# Patient Record
Sex: Female | Born: 1960 | Race: White | Hispanic: No | Marital: Single | Smoking: Former smoker
Health system: Southern US, Community
[De-identification: ages and names within clinical notes are randomized; demographics above are authoritative.]

## PROBLEM LIST (undated history)

## (undated) ENCOUNTER — Emergency Department (HOSPITAL_COMMUNITY): Admission: EM | Payer: BC Managed Care – PPO | Source: Home / Self Care

## (undated) DIAGNOSIS — E785 Hyperlipidemia, unspecified: Secondary | ICD-10-CM

## (undated) DIAGNOSIS — E079 Disorder of thyroid, unspecified: Secondary | ICD-10-CM

## (undated) HISTORY — DX: Disorder of thyroid, unspecified: E07.9

## (undated) HISTORY — DX: Hyperlipidemia, unspecified: E78.5

---

## 1997-04-21 ENCOUNTER — Ambulatory Visit (HOSPITAL_COMMUNITY): Admission: RE | Admit: 1997-04-21 | Discharge: 1997-04-21 | Payer: Self-pay | Admitting: Obstetrics and Gynecology

## 1997-12-08 ENCOUNTER — Encounter: Admission: RE | Admit: 1997-12-08 | Discharge: 1998-03-08 | Payer: Self-pay | Admitting: Internal Medicine

## 1998-03-18 ENCOUNTER — Inpatient Hospital Stay (HOSPITAL_COMMUNITY): Admission: AD | Admit: 1998-03-18 | Discharge: 1998-03-18 | Payer: Self-pay | Admitting: Gynecology

## 1998-03-28 ENCOUNTER — Inpatient Hospital Stay (HOSPITAL_COMMUNITY): Admission: AD | Admit: 1998-03-28 | Discharge: 1998-03-28 | Payer: Self-pay | Admitting: Gynecology

## 1998-04-11 ENCOUNTER — Inpatient Hospital Stay (HOSPITAL_COMMUNITY): Admission: AD | Admit: 1998-04-11 | Discharge: 1998-04-11 | Payer: Self-pay | Admitting: Gynecology

## 1998-04-30 ENCOUNTER — Inpatient Hospital Stay (HOSPITAL_COMMUNITY): Admission: AD | Admit: 1998-04-30 | Discharge: 1998-04-30 | Payer: Self-pay | Admitting: Obstetrics and Gynecology

## 1998-05-11 ENCOUNTER — Inpatient Hospital Stay (HOSPITAL_COMMUNITY): Admission: AD | Admit: 1998-05-11 | Discharge: 1998-05-14 | Payer: Self-pay | Admitting: Gynecology

## 1999-08-15 ENCOUNTER — Encounter: Admission: RE | Admit: 1999-08-15 | Discharge: 1999-11-13 | Payer: Self-pay | Admitting: Gynecology

## 2002-02-24 ENCOUNTER — Other Ambulatory Visit: Admission: RE | Admit: 2002-02-24 | Discharge: 2002-02-24 | Payer: Self-pay | Admitting: Gynecology

## 2002-03-13 ENCOUNTER — Encounter: Admission: RE | Admit: 2002-03-13 | Discharge: 2002-06-11 | Payer: Self-pay | Admitting: Gynecology

## 2003-05-12 ENCOUNTER — Other Ambulatory Visit: Admission: RE | Admit: 2003-05-12 | Discharge: 2003-05-12 | Payer: Self-pay | Admitting: Gynecology

## 2003-12-24 ENCOUNTER — Ambulatory Visit: Payer: Self-pay | Admitting: Family Medicine

## 2003-12-28 ENCOUNTER — Ambulatory Visit: Payer: Self-pay | Admitting: Internal Medicine

## 2004-01-13 ENCOUNTER — Ambulatory Visit: Payer: Self-pay | Admitting: Internal Medicine

## 2004-01-20 ENCOUNTER — Ambulatory Visit: Payer: Self-pay | Admitting: Internal Medicine

## 2004-01-27 ENCOUNTER — Ambulatory Visit: Payer: Self-pay | Admitting: Internal Medicine

## 2004-06-13 ENCOUNTER — Other Ambulatory Visit: Admission: RE | Admit: 2004-06-13 | Discharge: 2004-06-13 | Payer: Self-pay | Admitting: Gynecology

## 2004-06-21 ENCOUNTER — Ambulatory Visit: Payer: Self-pay | Admitting: Internal Medicine

## 2004-07-21 ENCOUNTER — Encounter: Admission: RE | Admit: 2004-07-21 | Discharge: 2004-10-19 | Payer: Self-pay | Admitting: Gynecology

## 2005-10-13 ENCOUNTER — Other Ambulatory Visit: Admission: RE | Admit: 2005-10-13 | Discharge: 2005-10-13 | Payer: Self-pay | Admitting: Gynecology

## 2006-10-16 DIAGNOSIS — E119 Type 2 diabetes mellitus without complications: Secondary | ICD-10-CM

## 2006-10-16 DIAGNOSIS — E039 Hypothyroidism, unspecified: Secondary | ICD-10-CM | POA: Insufficient documentation

## 2009-11-16 ENCOUNTER — Encounter
Admission: RE | Admit: 2009-11-16 | Discharge: 2009-11-16 | Payer: Self-pay | Source: Home / Self Care | Attending: Endocrinology | Admitting: Endocrinology

## 2011-07-12 ENCOUNTER — Encounter: Payer: BC Managed Care – PPO | Attending: Endocrinology | Admitting: Dietician

## 2011-07-12 ENCOUNTER — Encounter: Payer: Self-pay | Admitting: Dietician

## 2011-07-12 VITALS — Ht 63.0 in | Wt 235.2 lb

## 2011-07-12 DIAGNOSIS — Z713 Dietary counseling and surveillance: Secondary | ICD-10-CM | POA: Insufficient documentation

## 2011-07-12 DIAGNOSIS — E039 Hypothyroidism, unspecified: Secondary | ICD-10-CM | POA: Insufficient documentation

## 2011-07-12 DIAGNOSIS — E119 Type 2 diabetes mellitus without complications: Secondary | ICD-10-CM | POA: Insufficient documentation

## 2011-07-12 NOTE — Patient Instructions (Addendum)
   Keep up the walking 20+ minutes for 4 times per week and keep moving the bar up.  When you see Dr. Talmage Nap at your next visit, discuss blood glucose monitoring if you wish to monitor.  She will need to give you a prescription for the meter, strips and lancets.  Continue to read you food labels.  Look at serving size and then go to the Total Carbohydrate column  Use the raw number of total carbohydrate.  Try to always have fiber in a product.  Fiber slows there glucose response.  Bread= 2 or more gm fiber per serving.  Cereals and other products = 3+ gm of fiber per serving.  Sugar:  Keep the sugar in the product at (0-9) gm per serving.  Try to have a protein food at every meal and snack.  Try to get 32 oz of water of fluids each day.  Use the meal plan suggestions for helping to keep the calories at about 1400/day and the carbohydrates at 30-14 gm per meal.  Try using the snack suggestions.  Over the summer aim for some weight loss.    You have the right attitude for dealing with this issue.  I enjoyed working with you today.  Let me know if you have further questions.

## 2011-07-12 NOTE — Progress Notes (Signed)
  Medical Nutrition Therapy:  Appt start time: 1500 end time:  1600.   Assessment:  Primary concerns today: Getting my blood sugar under control. Reports her current A1C is at 10.0%.  History of having some possible s/s of pre-diabetes for about 1-2 years and at last MD appointment, found to have DM type 2.  Concerned and wants to gain control of her glucose.  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  Describes her life as stressful.  Plans to spend the time that she is off this summer, trying to get into better shape and gain control of the blood glucose.  BLOOD GLUCOSE: Not Currently monitoring  HYPOGLYCEMIA:  Gives some history of the signs and symptoms of low blood glucose which she treated by eating some peanut butter.  Did a brief review of hypoglycemia.  MEDICATIONS: Completed med review.  Currently on Glimepiride 4 mg for glucose control.  Gives a history of not tolerating Metformin.   DIETARY INTAKE:  Usual eating pattern includes 3 meals and rare snacks per day.  Everyday foods includes:most meats and vegetables..  Avoided foods: include fruit unless it is a smoothie.    24-hr recall:  B ( AM): 7:30  Diet coke. Bowl of cereal (rice crispies or cheerios) and 1 % milk OR English Muffin (high Fiber) with PB, OR toast (whit or white wheat)with cheese and a touch of butter. Snk ( AM): none  L ( PM): 12:15 Makes sandwich of tuna or PB and maybe some sun chips OR leftovers of grill chicken and rice. Water Snk ( PM): Usually no snack. Sometimes some nuts or a bit of PB. D ( PM): 6:30 Fish with zucchini and onions (ate about 1/2 of the meal.) had a salad and some soup. OR Grill hamburger or chicken, rare hot dog.  Starch (potatoes, rice,) and the non-starchy veggies and rolls.Drinks water or diet V8  Snk ( PM): Try not to have a snack. If having a craving for something, will have a weight watchers fudgscicle.  If craving.  OR maybe  popcorn. Beverages: Diet Coke, Diet Splash, water  Usual physical activity: Walking 1 mile 3-4 times per week.  Just started a few days ago  Estimated energy needs:HT:63 in  WT: 235.3 lb  BMI: 41.8 Kg/m2  Adj.Wt: 153 lb (70 kg) 1400 calories 150-155 g carbohydrates 100-105 g protein 37-39 g fat  Progress Towards Goal(s):  In progress.   Nutritional Diagnosis:  Man-2.1 Inpaired nutrition utilization As related to glucose metabolism.  As evidenced by Diagnosis of type 2 diabetes, HgA1C of 10.0%.    Intervention:  Nutrition Review of the carbohydrate restricted diet and the use of exercise, medication, and weight loss for glucose control.  Handouts given during visit include:  Living Well with Diabetes  Blood Glucose Control  Snack Suggestion List  Menu Suggestions for 30 and 45 gm CHO meals  Monitoring/Evaluation:  Dietary intake, exercise, blood glucose status, and body weight In 6-8 weeks.

## 2011-07-13 ENCOUNTER — Encounter: Payer: Self-pay | Admitting: Dietician

## 2011-09-12 ENCOUNTER — Ambulatory Visit: Payer: BC Managed Care – PPO | Admitting: Dietician

## 2011-10-11 ENCOUNTER — Ambulatory Visit: Payer: BC Managed Care – PPO | Admitting: Dietician

## 2012-08-28 ENCOUNTER — Other Ambulatory Visit: Payer: Self-pay

## 2013-05-15 ENCOUNTER — Encounter: Payer: Self-pay | Admitting: Dietician

## 2013-05-15 ENCOUNTER — Encounter: Payer: BC Managed Care – PPO | Attending: Endocrinology | Admitting: Dietician

## 2013-05-15 VITALS — Ht 63.0 in | Wt 242.8 lb

## 2013-05-15 DIAGNOSIS — Z713 Dietary counseling and surveillance: Secondary | ICD-10-CM | POA: Insufficient documentation

## 2013-05-15 DIAGNOSIS — E119 Type 2 diabetes mellitus without complications: Secondary | ICD-10-CM | POA: Insufficient documentation

## 2013-05-15 NOTE — Progress Notes (Signed)
  Medical Nutrition Therapy:  Appt start time: 1645 end time:  1745.   Assessment:  Primary concerns today: Rose Johnson is here today for help with diabetes and to help lose weight. Most recently added a new medication 3 weeks ago and has been making some changes to her diet. Planning to join the gym soon.   Taking blood sugar 3 x day (always below 140 mg/dl). Hgb A1c was 11.1% in March.   Works as a Runner, broadcasting/film/videoteacher and lives with her husband and 2 kids. Has made some changes to her diet such as cutting back on carbs. Goes out to eat about 2 x week.   Preferred Learning Style:   No preference indicated   Learning Readiness:   Ready  MEDICATIONS: see list   DIETARY INTAKE:  Avoided foods include: fruits    24-hr recall:  B ( AM): cereal (Cheerios or Kix) with 1-2% milk or high fiber English Muffin may skip on weekend or [place] sausge Snk ( AM): nuts or peanut butter crackers L ( PM): sandwich on thin bread with [place] or leftover chicken or meatballs  Snk ( PM): nuts or peanut butter crackers D ( PM): grilled chicken or hamburger or egg salad or meatballs  Snk ( PM):spoonful of peanut butter Beverages: 1 soda and water  Usual physical activity: none yet  Estimated energy needs: 1600 calories 180 g carbohydrates 120 g protein 44 g fat  Progress Towards Goal(s):  In progress.   Nutritional Diagnosis:  Hoagland-2.1 Inpaired nutrition utilization As related to glucose metabolism.  As evidenced by Hgb A1c of 11.1%.    Intervention:  Nutrition counseling provided. Discussed the pathophysiology of insulin resistance and factors that affect blood glucose such as carbohydrate foods, exercise, weight loss, and stress. Goals:  Follow Diabetes Meal Plan as instructed  Eat 3 meals and 2 snacks, every 3-5 hrs  Limit carbohydrate intake to 30-45 grams carbohydrate/meal  Limit carbohydrate intake to 0-15 grams carbohydrate/snack  Add lean protein foods to meals/snacks  Monitor glucose levels as  instructed by your doctor  Aim for 30 mins of physical activity daily  Bring food record and glucose log to your next nutrition visit  Teaching Method Utilized:  Visual Auditory Hands on  Handouts given during visit include:  Living Well With Diabetes  Yellow Card  MyPlate Handout  15 g CHO Snacks  Carbohydrate Counting  Barriers to learning/adherence to lifestyle change: none  Demonstrated degree of understanding via:  Teach Back   Monitoring/Evaluation:  Dietary intake, exercise, and body weight in 6 week(s).

## 2013-05-15 NOTE — Patient Instructions (Signed)

## 2013-07-09 ENCOUNTER — Ambulatory Visit: Payer: BC Managed Care – PPO | Admitting: Dietician

## 2013-09-03 ENCOUNTER — Ambulatory Visit: Payer: BC Managed Care – PPO | Admitting: Dietician

## 2014-07-26 ENCOUNTER — Ambulatory Visit (INDEPENDENT_AMBULATORY_CARE_PROVIDER_SITE_OTHER): Payer: BC Managed Care – PPO

## 2014-07-26 ENCOUNTER — Ambulatory Visit (INDEPENDENT_AMBULATORY_CARE_PROVIDER_SITE_OTHER): Payer: BC Managed Care – PPO | Admitting: Emergency Medicine

## 2014-07-26 VITALS — HR 118 | Temp 97.8°F | Resp 20 | Ht 63.0 in | Wt 242.0 lb

## 2014-07-26 DIAGNOSIS — M79645 Pain in left finger(s): Secondary | ICD-10-CM

## 2014-07-26 DIAGNOSIS — S63619A Unspecified sprain of unspecified finger, initial encounter: Secondary | ICD-10-CM | POA: Diagnosis not present

## 2014-07-26 NOTE — Patient Instructions (Signed)
Finger Sprain  A finger sprain is a tear in one of the strong, fibrous tissues that connect the bones (ligaments) in your finger. The severity of the sprain depends on how much of the ligament is torn. The tear can be either partial or complete.  CAUSES   Often, sprains are a result of a fall or accident. If you extend your hands to catch an object or to protect yourself, the force of the impact causes the fibers of your ligament to stretch too much. This excess tension causes the fibers of your ligament to tear.  SYMPTOMS   You may have some loss of motion in your finger. Other symptoms include:   Bruising.   Tenderness.   Swelling.  DIAGNOSIS   In order to diagnose finger sprain, your caregiver will physically examine your finger or thumb to determine how torn the ligament is. Your caregiver may also suggest an X-ray exam of your finger to make sure no bones are broken.  TREATMENT   If your ligament is only partially torn, treatment usually involves keeping the finger in a fixed position (immobilization) for a short period. To do this, your caregiver will apply a bandage, cast, or splint to keep your finger from moving until it heals. For a partially torn ligament, the healing process usually takes 2 to 3 weeks.  If your ligament is completely torn, you may need surgery to reconnect the ligament to the bone. After surgery a cast or splint will be applied and will need to stay on your finger or thumb for 4 to 6 weeks while your ligament heals.  HOME CARE INSTRUCTIONS   Keep your injured finger elevated, when possible, to decrease swelling.   To ease pain and swelling, apply ice to your joint twice a day, for 2 to 3 days:   Put ice in a plastic bag.   Place a towel between your skin and the bag.   Leave the ice on for 15 minutes.   Only take over-the-counter or prescription medicine for pain as directed by your caregiver.   Do not wear rings on your injured finger.   Do not leave your finger unprotected  until pain and stiffness go away (usually 3 to 4 weeks).   Do not allow your cast or splint to get wet. Cover your cast or splint with a plastic bag when you shower or bathe. Do not swim.   Your caregiver may suggest special exercises for you to do during your recovery to prevent or limit permanent stiffness.  SEEK IMMEDIATE MEDICAL CARE IF:   Your cast or splint becomes damaged.   Your pain becomes worse rather than better.  MAKE SURE YOU:   Understand these instructions.   Will watch your condition.   Will get help right away if you are not doing well or get worse.  Document Released: 03/02/2004 Document Revised: 04/17/2011 Document Reviewed: 09/26/2010  ExitCare Patient Information 2015 [employer]. This information is not intended to replace advice given to you by your health care provider. Make sure you discuss any questions you have with your health care provider.

## 2014-07-26 NOTE — Progress Notes (Signed)
Subjective:  Patient ID: Rose Johnson, female    DOB: Sep 04, 1960  Age: 53 y.o. 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 She apparently jammed her third and fourth finger of the hand a week ago. She said no improvement she has pain in the PIP and DIP joints of third and fourth finger. She has some swelling and limitation of motion. She denies any pain that she cannot control with over-the-counter medication.  History Rose Johnson has a past medical history of Thyroid disease; Diabetes mellitus; and Hyperlipidemia.   She has no past surgical history on file.   Her  family history includes Diabetes in her father; Hyperlipidemia in her maternal grandmother and mother.  She   reports that she quit smoking about 18 years ago. She does not have any smokeless tobacco history on file. She reports that she does not drink alcohol or use illicit drugs.  Outpatient Prescriptions Prior to Visit  Medication Sig Dispense Refill  . glimepiride (AMARYL) 4 MG tablet Take 4 mg by mouth daily before breakfast.    . levothyroxine (SYNTHROID, LEVOTHROID) 175 MCG tablet Take 175 mcg by mouth daily.    . simvastatin (ZOCOR) 5 MG tablet Take 5 mg by mouth daily.     No facility-administered medications prior to visit.    History   Social History  . Marital Status: Single    Spouse Name: N/A  . Number of Children: N/A  . Years of Education: N/A   Social History Main Topics  . Smoking status: Former Smoker    Quit date: 07/11/1996  . Smokeless tobacco: Not on file  . Alcohol Use: No  . Drug Use: No  . Sexual Activity: Not on file   Other Topics Concern  . None   Social History Narrative     Review of Systems  Constitutional: Negative for fever, chills and appetite change.  HENT: Negative for congestion, ear pain, postnasal drip, sinus pressure and sore throat.   Eyes: Negative for pain and redness.   Respiratory: Negative for cough, shortness of breath and wheezing.   Cardiovascular: Negative for leg swelling.  Gastrointestinal: Negative for nausea, vomiting, abdominal pain, diarrhea, constipation and blood in stool.  Endocrine: Negative for polyuria.  Genitourinary: Negative for dysuria, urgency, frequency and flank pain.  Musculoskeletal: Positive for joint swelling. Negative for gait problem.  Skin: Negative for rash.  Neurological: Negative for weakness and headaches.  Psychiatric/Behavioral: Negative for confusion and decreased concentration. The patient is not nervous/anxious.     Objective:  Pulse 118  Temp(Src) 97.8 F (36.6 C)  Resp 20  Ht 5\' 3"  (1.6 m)  Wt 242 lb (109.77 kg)  BMI 42.88 kg/m2  SpO2 98%  Physical Exam    Assessment & Plan:   Rose Johnson was seen today for finger injury.  Diagnoses and all orders for this visit:  Pain of finger of left hand Orders: -     DG Hand Complete Left; Future   I am having Rose Johnson maintain her levothyroxine, glimepiride, and simvastatin.  No orders of the defined types were placed in this encounter.    Appropriate red flag conditions were discussed with the patient as well as actions that should be taken.  Patient expressed his understanding.  Follow-up: No Follow-up on file.  Carmelina Dane, MD    [facility] reading (PRIMARY) by  Dr. Dareen Piano negative fingers.Marland Kitchen

## 2015-08-23 ENCOUNTER — Encounter (HOSPITAL_COMMUNITY): Payer: Self-pay

## 2015-08-23 ENCOUNTER — Emergency Department (HOSPITAL_COMMUNITY): Payer: BC Managed Care – PPO

## 2015-08-23 ENCOUNTER — Emergency Department (HOSPITAL_COMMUNITY)
Admission: EM | Admit: 2015-08-23 | Discharge: 2015-08-23 | Disposition: A | Discharge status: Home / Self Care | Payer: BC Managed Care – PPO | Attending: Emergency Medicine | Admitting: Emergency Medicine

## 2015-08-23 DIAGNOSIS — R300 Dysuria: Secondary | ICD-10-CM | POA: Insufficient documentation

## 2015-08-23 DIAGNOSIS — Z79899 Other long term (current) drug therapy: Secondary | ICD-10-CM | POA: Diagnosis not present

## 2015-08-23 DIAGNOSIS — Z7984 Long term (current) use of oral hypoglycemic drugs: Secondary | ICD-10-CM | POA: Insufficient documentation

## 2015-08-23 DIAGNOSIS — E785 Hyperlipidemia, unspecified: Secondary | ICD-10-CM | POA: Insufficient documentation

## 2015-08-23 DIAGNOSIS — R000 Tachycardia, unspecified: Secondary | ICD-10-CM | POA: Diagnosis not present

## 2015-08-23 DIAGNOSIS — Z791 Long term (current) use of non-steroidal anti-inflammatories (NSAID): Secondary | ICD-10-CM | POA: Insufficient documentation

## 2015-08-23 DIAGNOSIS — Z87891 Personal history of nicotine dependence: Secondary | ICD-10-CM | POA: Insufficient documentation

## 2015-08-23 DIAGNOSIS — E119 Type 2 diabetes mellitus without complications: Secondary | ICD-10-CM | POA: Insufficient documentation

## 2015-08-23 DIAGNOSIS — R319 Hematuria, unspecified: Secondary | ICD-10-CM | POA: Insufficient documentation

## 2015-08-23 LAB — CBC WITH DIFFERENTIAL/PLATELET
BASOS ABS: 0 10*3/uL (ref 0.0–0.1)
Basophils Relative: 0 %
Eosinophils Absolute: 0 10*3/uL (ref 0.0–0.7)
Eosinophils Relative: 0 %
HCT: 43.6 % (ref 36.0–46.0)
HEMOGLOBIN: 14 g/dL (ref 12.0–15.0)
LYMPHS ABS: 2.5 10*3/uL (ref 0.7–4.0)
LYMPHS PCT: 16 %
MCH: 26.9 pg (ref 26.0–34.0)
MCHC: 32.1 g/dL (ref 30.0–36.0)
MCV: 83.7 fL (ref 78.0–100.0)
Monocytes Absolute: 0.6 10*3/uL (ref 0.1–1.0)
Monocytes Relative: 4 %
NEUTROS ABS: 12.5 10*3/uL — AB (ref 1.7–7.7)
NEUTROS PCT: 80 %
PLATELETS: 298 10*3/uL (ref 150–400)
RBC: 5.21 MIL/uL — AB (ref 3.87–5.11)
RDW: 13.5 % (ref 11.5–15.5)
WBC: 15.6 10*3/uL — AB (ref 4.0–10.5)

## 2015-08-23 LAB — URINALYSIS, ROUTINE W REFLEX MICROSCOPIC
BILIRUBIN URINE: NEGATIVE
KETONES UR: NEGATIVE mg/dL
Leukocytes, UA: NEGATIVE
NITRITE: NEGATIVE
PH: 6 (ref 5.0–8.0)
Protein, ur: >300 mg/dL — AB
SPECIFIC GRAVITY, URINE: 1.035 — AB (ref 1.005–1.030)

## 2015-08-23 LAB — COMPREHENSIVE METABOLIC PANEL
ALK PHOS: 60 U/L (ref 38–126)
ALT: 45 U/L (ref 14–54)
AST: 38 U/L (ref 15–41)
Albumin: 5 g/dL (ref 3.5–5.0)
Anion gap: 13 (ref 5–15)
BUN: 16 mg/dL (ref 6–20)
CALCIUM: 9.9 mg/dL (ref 8.9–10.3)
CHLORIDE: 101 mmol/L (ref 101–111)
CO2: 25 mmol/L (ref 22–32)
CREATININE: 1.04 mg/dL — AB (ref 0.44–1.00)
GFR, EST NON AFRICAN AMERICAN: 60 mL/min — AB (ref >60)
Glucose, Bld: 247 mg/dL — ABNORMAL HIGH (ref 65–99)
Potassium: 3.7 mmol/L (ref 3.5–5.1)
Sodium: 139 mmol/L (ref 135–145)
Total Bilirubin: 0.9 mg/dL (ref 0.3–1.2)
Total Protein: 8.8 g/dL — ABNORMAL HIGH (ref 6.5–8.1)

## 2015-08-23 LAB — URINE MICROSCOPIC-ADD ON

## 2015-08-23 LAB — HCG, QUANTITATIVE, PREGNANCY: HCG, BETA CHAIN, QUANT, S: 3 m[IU]/mL (ref <5)

## 2015-08-23 MED ORDER — IOPAMIDOL (ISOVUE-300) INJECTION 61%
100.0000 mL | Freq: Once | INTRAVENOUS | Status: AC | PRN
  Administered 2015-08-23: 100 mL via INTRAVENOUS

## 2015-08-23 MED ORDER — KETOROLAC TROMETHAMINE 30 MG/ML IJ SOLN
30.0000 mg | Freq: Once | INTRAMUSCULAR | Status: AC
  Administered 2015-08-23: 30 mg via INTRAVENOUS
  Filled 2015-08-23: qty 1

## 2015-08-23 MED ORDER — LORAZEPAM 2 MG/ML IJ SOLN
1.0000 mg | Freq: Once | INTRAMUSCULAR | Status: AC
  Administered 2015-08-23: 1 mg via INTRAVENOUS
  Filled 2015-08-23: qty 1

## 2015-08-23 MED ORDER — HYDROCHLOROTHIAZIDE 25 MG PO TABS: 25.0000 mg | ORAL_TABLET | Freq: Every day | ORAL | Status: DC

## 2015-08-23 MED ORDER — SODIUM CHLORIDE 0.9 % IV BOLUS (SEPSIS)
1000.0000 mL | Freq: Once | INTRAVENOUS | Status: AC
  Administered 2015-08-23: 1000 mL via INTRAVENOUS

## 2015-08-23 MED ORDER — METOPROLOL TARTRATE 5 MG/5ML IV SOLN
5.0000 mg | Freq: Once | INTRAVENOUS | Status: AC
  Administered 2015-08-23: 5 mg via INTRAVENOUS
  Filled 2015-08-23: qty 5

## 2015-08-23 MED ORDER — ENOXAPARIN SODIUM 120 MG/0.8ML ~~LOC~~ SOLN: 1.0000 mg/kg | Freq: Once | SUBCUTANEOUS | Status: DC

## 2015-08-23 MED ORDER — METOPROLOL TARTRATE 25 MG PO TABS: 12.5000 mg | ORAL_TABLET | Freq: Two times a day (BID) | ORAL | Status: DC

## 2015-08-23 NOTE — Discharge Instructions (Signed)
Please follow up with your primary doctor for discussion of your diagnoses and further evaluation after today's visit; Please return to the ER for new or worsening symptoms, any additional concerns.  °

## 2015-08-23 NOTE — ED Notes (Signed)
Pt ambulatory and independent at discharge.  

## 2015-08-23 NOTE — ED Notes (Signed)
Per pt, started having back pain yesterday.  Pt then urinating blood today.  Pt has burning with urination.

## 2015-08-23 NOTE — ED Provider Notes (Signed)
Briefly, patient presents for hematuria and lower back pain since last night.  Today, no back pain just hematuria and urinary frequency.  UA with large hgb, TNTC RBCs, negative leukocytes, 0-5 WBCs.  No signs of infection.  Cr 1.04.  CT abdomen/pelvis unremarkable.  Sxs likely due to recently passed stone.  Patient is extremely anxious, HR initially 143.  Currently HR 124.  BP initially 214/133, now 178/127.  This is likely 2/2 to anxiety.  Patient has received 2 mg of Ativan and 1L IVF.  EKG obtained due to tachycardia, this shows ST.  Plan to discharge home as tachycardia improves.    Patient remains tachycardic, HR 139.  Denies CP, SOB, dizziness, lightheadedness.  Denies stimulant use.  She is not on any BP medications.  Drinks 3 cans of diet coke a day.  Will give 5 mg Lopressor. HR 106, BP 165/107.  D/c home with Lopressor and HCTZ.  Follow up PCP.  Case has been discussed with Dr. Anitra LauthPlunkett who agrees with the above plan for discharge.     Cheri FowlerKayla Tearia Gibbs, PA-C 08/23/15 2337  Gwyneth SproutWhitney Plunkett, MD 08/23/15 2340

## 2015-08-23 NOTE — ED Provider Notes (Signed)
CSN: [id number]     Arrival date & time 08/23/15  1715 History   First MD Initiated Contact with Patient 08/23/15 1819     Chief Complaint  Patient presents with  . Hematuria    (Consider location/radiation/quality/duration/timing/severity/associated sxs/prior Treatment) Patient is a 55 y.o. female presenting with hematuria. The history is provided by the patient and medical records. No language interpreter was used.  Hematuria Pertinent negatives include no abdominal pain, chills, congestion, coughing, fever, headaches, nausea, neck pain, rash or vomiting.    Rose Johnson is a 55 y.o. female  with a PMH of DM, HLD, thyroid disorder who presents to the Emergency Department complaining of intermittent cramping low back pain last night. When she awoke this morning, she had no back pain, but saw blood in her urine and was experiencing urinary frequency and burning at the end of voiding. She went to the urgent care who took sample of urine but stated there was a large amount of blood and she needed to be evaluated in ER. Blood is only in urine, not seen on toilet paper when wiping. No medications taken prior to arrival for symptoms. At present, she denies any abdominal pain, back pain, flank pain. No fever, chest pain, shortness of breath, vaginal discharge.    Past Medical History  Diagnosis Date  . Thyroid disease   . Diabetes mellitus   . Hyperlipidemia    History reviewed. No pertinent past surgical history. Family History  Problem Relation Age of Onset  . Hyperlipidemia Mother   . Diabetes Father   . Hyperlipidemia Maternal Grandmother    Social History  Substance Use Topics  . Smoking status: Former Smoker    Quit date: 07/11/1996  . Smokeless tobacco: None  . Alcohol Use: No   OB History    No data available     Review of Systems  Constitutional: Negative for fever and chills.  HENT: Negative for congestion.   Eyes: Negative for visual disturbance.  Respiratory:  Negative for cough and shortness of breath.   Cardiovascular: Negative.   Gastrointestinal: Negative for nausea, vomiting and abdominal pain.  Genitourinary: Positive for dysuria and hematuria. Negative for vaginal bleeding, vaginal discharge and vaginal pain.  Musculoskeletal: Positive for back pain (Resolved). Negative for neck pain.  Skin: Negative for rash.  Neurological: Negative for headaches.      Allergies  Review of patient's allergies indicates no known allergies.  Home Medications   Prior to Admission medications   Medication Sig Start Date End Date Taking? Authorizing Provider  glimepiride (AMARYL) 4 MG tablet Take 4 mg by mouth daily before breakfast.   Yes Historical Provider, MD  ibuprofen (ADVIL,MOTRIN) 200 MG tablet Take 200 mg by mouth every 6 (six) hours as needed for moderate pain.   Yes Historical Provider, MD  levothyroxine (SYNTHROID, LEVOTHROID) 150 MCG tablet Take 150 mcg by mouth daily. 07/15/15  Yes Historical Provider, MD  metFORMIN (GLUCOPHAGE-XR) 500 MG 24 hr tablet Take 1,000 mg by mouth 2 (two) times daily. 07/15/15  Yes Historical Provider, MD  simvastatin (ZOCOR) 10 MG tablet TAKE 1 TABLET BY MOUTH ONCE DAILY 07/15/15  Yes Historical Provider, MD   BP 178/127 mmHg  Pulse 124  Temp(Src) 98.5 F (36.9 C) (Oral)  Resp 18  Ht 5\' 3"  (1.6 m)  Wt 108.863 kg  BMI 42.52 kg/m2  SpO2 97% Physical Exam  Constitutional: She is oriented to person, place, and time. She appears well-developed and well-nourished. No distress.  HENT:  Head: Normocephalic and atraumatic.  Cardiovascular: Normal heart sounds and intact distal pulses.  Exam reveals no gallop and no friction rub.   No murmur heard. Tachycardic but regular.   Pulmonary/Chest: Effort normal and breath sounds normal. No respiratory distress. She has no wheezes. She has no rales. She exhibits no tenderness.  Abdominal: Soft. Bowel sounds are normal. She exhibits no distension. There is no tenderness.  No  CVA or flank tenderness.   Musculoskeletal: She exhibits no edema.  Neurological: She is alert and oriented to person, place, and time.  Skin: Skin is warm and dry.  Nursing note and vitals reviewed.   ED Course  Procedures (including critical care time) Labs Review Labs Reviewed  URINALYSIS, ROUTINE W REFLEX MICROSCOPIC (NOT AT [facility]) - Abnormal; Notable for the following:    Color, Urine RED (*)    APPearance CLOUDY (*)    Specific Gravity, Urine 1.035 (*)    Glucose, UA >1000 (*)    Hgb urine dipstick LARGE (*)    Protein, ur >300 (*)    All other components within normal limits  URINE MICROSCOPIC-ADD ON - Abnormal; Notable for the following:    Squamous Epithelial / LPF 0-5 (*)    Bacteria, UA FEW (*)    All other components within normal limits  CBC WITH DIFFERENTIAL/PLATELET - Abnormal; Notable for the following:    WBC 15.6 (*)    RBC 5.21 (*)    Neutro Abs 12.5 (*)    All other components within normal limits  COMPREHENSIVE METABOLIC PANEL - Abnormal; Notable for the following:    Glucose, Bld 247 (*)    Creatinine, Ser 1.04 (*)    Total Protein 8.8 (*)    GFR calc non Af Amer 60 (*)    All other components within normal limits  URINE CULTURE  HCG, QUANTITATIVE, PREGNANCY    Imaging Review Ct Abdomen Pelvis W Contrast  08/23/2015  CLINICAL DATA:  Low back pain beginning last night. The patient reports hematuria. EXAM: CT ABDOMEN AND PELVIS WITH CONTRAST TECHNIQUE: Multidetector CT imaging of the abdomen and pelvis was performed using the standard protocol following bolus administration of intravenous contrast. CONTRAST:  100 ml ISOVUE-300 IOPAMIDOL (ISOVUE-300) INJECTION 61% COMPARISON:  None. FINDINGS: The lung bases are clear.  No pleural or pericardial effusion. The right kidney is ptotic. The kidneys are otherwise unremarkable. Uterus, adnexa and urinary bladder appear normal. The gallbladder appears normal. The liver is somewhat low attenuating compatible with  fatty infiltration. The spleen, adrenal glands and pancreas appear normal. The stomach, small and large bowel and appendix appear normal. There is no lymphadenopathy or fluid. No lytic or sclerotic bony lesion is identified. Multilevel disc bulging is noted. IMPRESSION: No acute abnormality or finding to explain the patient's symptoms. Fatty infiltration of the liver. Electronically Signed   By: Drusilla Kanner M.D.   On: 08/23/2015 21:11   I have personally reviewed and evaluated these images and lab results as part of my medical decision-making.   EKG Interpretation None      MDM   Final diagnoses:  None   Rose Johnson presents to ED for hematuria. Back pain yesterday which has resolved. No pain at this time. Patient is very anxious and worried. BP and heart rate elevated. UA reviewed - hematuria but no signs of infection. Will obtain labs and give fluids.  ativan given as well.   Labs reviewed. Mildly elevated creatinine, wbc elevated as well. CT abd/pelvis ordered and reassuring.  Patient likely passed / is passing kidney stone.   Heart rate still elevated after fluids and 1 of ativan. Will obtain EKG and give 1 more mg of ativan. Care assumed by oncoming provider PA Rose. Case discussed, plan agreed upon. Will follow up on EKG and monitor vitals signs with likely discharge to home.   [facility] HospitalJaime Pilcher Ward, PA-C 08/23/15 2159  Gwyneth SproutWhitney Plunkett, MD 08/23/15 2340

## 2015-08-23 NOTE — ED Notes (Signed)
Pt's husband repeatedly coming out of room asking about discharge.  Pt and husband informed PA and MD will make decision on discharge after reviewing pt's vital signs.  Pt's husband becoming more agitated stating "we have already been here for 8 hours, can you not hurry?".  Still awaiting word from MD.

## 2015-08-23 NOTE — ED Notes (Signed)
PA made aware of pt's increasing anxiety.

## 2015-08-23 NOTE — ED Notes (Signed)
Pt assisted to restroom. Pt stated there was small amount of blood.

## 2015-08-23 NOTE — ED Notes (Signed)
Pt comes in today with a c/o hematuria. Pt states that she began to have lower back pain last night. Pt states that she woke this morning with some blood in her urine. Pt states that she went to urgent care prior to the ED and they sent her over here for further evaluation. Pt denies any history of UTI or kidney stones. Pt also c/o burning while urinating with frequency.

## 2015-08-25 ENCOUNTER — Ambulatory Visit (INDEPENDENT_AMBULATORY_CARE_PROVIDER_SITE_OTHER): Payer: BC Managed Care – PPO | Admitting: Family Medicine

## 2015-08-25 VITALS — BP 154/98 | HR 105 | Temp 98.1°F | Resp 20 | Ht 62.5 in | Wt 232.6 lb

## 2015-08-25 DIAGNOSIS — N3091 Cystitis, unspecified with hematuria: Secondary | ICD-10-CM

## 2015-08-25 DIAGNOSIS — R319 Hematuria, unspecified: Secondary | ICD-10-CM | POA: Diagnosis not present

## 2015-08-25 DIAGNOSIS — R3915 Urgency of urination: Secondary | ICD-10-CM

## 2015-08-25 DIAGNOSIS — E119 Type 2 diabetes mellitus without complications: Secondary | ICD-10-CM | POA: Diagnosis not present

## 2015-08-25 DIAGNOSIS — R000 Tachycardia, unspecified: Secondary | ICD-10-CM | POA: Diagnosis not present

## 2015-08-25 DIAGNOSIS — E039 Hypothyroidism, unspecified: Secondary | ICD-10-CM | POA: Diagnosis not present

## 2015-08-25 DIAGNOSIS — D72829 Elevated white blood cell count, unspecified: Secondary | ICD-10-CM

## 2015-08-25 DIAGNOSIS — I10 Essential (primary) hypertension: Secondary | ICD-10-CM | POA: Diagnosis not present

## 2015-08-25 DIAGNOSIS — I160 Hypertensive urgency: Secondary | ICD-10-CM

## 2015-08-25 LAB — POC MICROSCOPIC URINALYSIS (UMFC): MUCUS RE: ABSENT

## 2015-08-25 LAB — POCT URINALYSIS DIP (MANUAL ENTRY)
BILIRUBIN UA: NEGATIVE
Ketones, POC UA: NEGATIVE
NITRITE UA: POSITIVE — AB
PH UA: 7
Protein Ur, POC: 100 — AB
SPEC GRAV UA: 1.02
UROBILINOGEN UA: 0.2

## 2015-08-25 LAB — URINE CULTURE

## 2015-08-25 LAB — GLUCOSE, POCT (MANUAL RESULT ENTRY): POC Glucose: 162 mg/dl — AB (ref 70–99)

## 2015-08-25 MED ORDER — NITROFURANTOIN MONOHYD MACRO 100 MG PO CAPS: 100.0000 mg | ORAL_CAPSULE | Freq: Two times a day (BID) | ORAL | Status: AC

## 2015-08-25 MED ORDER — METOPROLOL TARTRATE 25 MG PO TABS: 12.5000 mg | ORAL_TABLET | Freq: Two times a day (BID) | ORAL | Status: AC

## 2015-08-25 NOTE — Patient Instructions (Addendum)
For the blood in the urine and your urinary symptoms, start the antibiotic, one pill twice per day. I will recheck the urine culture to see if there is infection on today's urine, and we will let you know about those results. If you have any worsening of the symptoms, fever, or back pain returns, return for recheck.  For your blood pressure, it did go down some when we rechecked it. You can continue one half pill of the metoprolol twice per day, but iff running over 160/100 - increase to 1 full pill twice per day. Follow-up with a primary care provider or us within the next 2 weeks to see if those readings are improved, and to discuss possible changes in meds (you may benefit from a different class of medication called ace inhibitors with having diabetes, but want to make sure kidney test is ok first). Keep a record of your blood pressures outside of the office and bring them to the next office visit.   Return to the clinic or go to the nearest emergency room if any of your symptoms worsen or new symptoms occur.     Urinary Tract Infection Urinary tract infections (UTIs) can develop anywhere along your urinary tract. Your urinary tract is your body's drainage system for removing wastes and extra water. Your urinary tract includes two kidneys, two ureters, a bladder, and a urethra. Your kidneys are a pair of bean-shaped organs. Each kidney is about the size of your fist. They are located below your ribs, one on each side of your spine. CAUSES Infections are caused by microbes, which are microscopic organisms, including fungi, viruses, and bacteria. These organisms are so small that they can only be seen through a microscope. Bacteria are the microbes that most commonly cause UTIs. SYMPTOMS  Symptoms of UTIs may vary by age and gender of the patient and by the location of the infection. Symptoms in young women typically include a frequent and intense urge to urinate and a painful, burning feeling in  the bladder or urethra during urination. Older women and men are more likely to be tired, shaky, and weak and have muscle aches and abdominal pain. A fever may mean the infection is in your kidneys. Other symptoms of a kidney infection include pain in your back or sides below the ribs, nausea, and vomiting. DIAGNOSIS To diagnose a UTI, your caregiver will ask you about your symptoms. Your caregiver will also ask you to provide a urine sample. The urine sample will be tested for bacteria and white blood cells. White blood cells are made by your body to help fight infection. TREATMENT  Typically, UTIs can be treated with medication. Because most UTIs are caused by a bacterial infection, they usually can be treated with the use of antibiotics. The choice of antibiotic and length of treatment depend on your symptoms and the type of bacteria causing your infection. HOME CARE INSTRUCTIONS  If you were prescribed antibiotics, take them exactly as your caregiver instructs you. Finish the medication even if you feel better after you have only taken some of the medication.  Drink enough water and fluids to keep your urine clear or pale yellow.  Avoid caffeine, tea, and carbonated beverages. They tend to irritate your bladder.  Empty your bladder often. Avoid holding urine for long periods of time.  Empty your bladder before and after sexual intercourse.  After a bowel movement, women should cleanse from front to back. Use each tissue only once. SEEK MEDICAL CARE  IF:   You have back pain.  You develop a fever.  Your symptoms do not begin to resolve within 3 days. SEEK IMMEDIATE MEDICAL CARE IF:   You have severe back pain or lower abdominal pain.  You develop chills.  You have nausea or vomiting.  You have continued burning or discomfort with urination. MAKE SURE YOU:   Understand these instructions.  Will watch your condition.  Will get help right away if you are not doing well or get  worse.   This information is not intended to replace advice given to you by your health care provider. Make sure you discuss any questions you have with your health care provider.   Document Released: 11/02/2004 Document Revised: 10/14/2014 Document Reviewed: 03/03/2011 Elsevier Interactive Patient Education 2016 [employer] Hematuria, Adult Hematuria is blood in your urine. It can be caused by a bladder infection, kidney infection, prostate infection, kidney stone, or cancer of your urinary tract. Infections can usually be treated with medicine, and a kidney stone usually will pass through your urine. If neither of these is the cause of your hematuria, further workup to find out the reason may be needed. It is very important that you tell your health care provider about any blood you see in your urine, even if the blood stops without treatment or happens without causing pain. Blood in your urine that happens and then stops and then happens again can be a symptom of a very serious condition. Also, pain is not a symptom in the initial stages of many urinary cancers. HOME CARE INSTRUCTIONS   Drink lots of fluid, 3-4 quarts a day. If you have been diagnosed with an infection, cranberry juice is especially recommended, in addition to large amounts of water.  Avoid caffeine, tea, and carbonated beverages because they tend to irritate the bladder.  Avoid alcohol because it may irritate the prostate.  Take all medicines as directed by your health care provider.  If you were prescribed an antibiotic medicine, finish it all even if you start to feel better.  If you have been diagnosed with a kidney stone, follow your health care provider's instructions regarding straining your urine to catch the stone.  Empty your bladder often. Avoid holding urine for long periods of time.  After a bowel movement, women should cleanse front to back. Use each tissue only once.  Empty your bladder before and  after sexual intercourse if you are a female. SEEK MEDICAL CARE IF:  You develop back pain.  You have a fever.  You have a feeling of sickness in your stomach (nausea) or vomiting.  Your symptoms are not better in 3 days. Return sooner if you are getting worse. SEEK IMMEDIATE MEDICAL CARE IF:   You develop severe vomiting and are unable to keep the medicine down.  You develop severe back or abdominal pain despite taking your medicines.  You begin passing a large amount of blood or clots in your urine.  You feel extremely weak or faint, or you pass out. MAKE SURE YOU:   Understand these instructions.  Will watch your condition.  Will get help right away if you are not doing well or get worse.   This information is not intended to replace advice given to you by your health care provider. Make sure you discuss any questions you have with your health care provider.   Document Released: 01/23/2005 Document Revised: 02/13/2014 Document Reviewed: 09/23/2012 [employer] Interactive Patient Education [employer]  IF you received an x-ray today, you will receive an invoice from [facility]. Please contact [facility] at [phone] with questions or concerns regarding your invoice.   IF you received labwork today, you will receive an invoice from [facility]. Please contact [facility] at [phone] with questions or concerns regarding your invoice.   Our billing staff will not be able to assist you with questions regarding bills from these companies.  You will be contacted with the lab results as soon as they are available. The fastest way to get your results is to activate your My Chart account. Instructions are located on the last page of this paperwork. If you have not heard from us regarding the results in 2 weeks, please contact this office.      

## 2015-08-25 NOTE — Progress Notes (Signed)
Subjective:  By signing my name below, I, Raven Small, attest that this documentation has been prepared under the direction and in the presence of Meredith Staggers, MD.  Electronically Signed: Andrew Au, ED Scribe. 08/25/2015. 5:02 PM.   Patient ID: Rose Johnson, female    DOB: 11/29/1960, 55 y.o.   MRN: [id number]  HPI Chief Complaint  Patient presents with  . Follow-up    hospital follow up - for Kidney stone  . Urinary Urgency    HPI Comments: Rose Johnson is a 55 y.o. female who presents to the [facility] follow up. She has a hx of HTN, DM, HLD. PCP is Dr. Talmage Nap.  She was seen in the ED 2 days ago with hematuria and low back pain. She noted low back pain the night before and blood in urine along with frequency and dysuria. No back pain at the time of that visit. Of note BP was 178/127 and was tachycardic with rate of 124 but afebrile. Urine was notable for gross hematuria. CBC WBC 15.6. Glucose was 247 on CMP. Creatine 1.04. She had CT abdomen and pelvis with no acute abnormality. Apparently she appeared anxious and was given 2 mg total of ativan. EKG was sinus tachy, multiple PVC's borderline right deviation. She was given 5mg  of lopressor with improvement to HR at 106 and BP at 165/107. Discharged on both lopressor and HCTZ. Urine culture multiple species, recommended repeat.   Pt states hematuria resolved yesterday.  Today she reports improving urgency, frequency and intermittent dysuria along with abdominal cramping. No back pain. She has been taking Azo.   Pt states her BP usually runs 140/85-90. She has not been on BP medication in past. She has been taking Lopressor, half tablet, 2 doses since seen in the ED, one last night and one this morning. She has not started HCTZ. She does not have home readings yet. Her HR has ranged between 80-90. She denies CP, slurred speech, HA's, and blurred vision.   She has hx of hypothyroidism, followed by Dr. Talmage Nap.   States she last had thyroid checked 11/2014. She is on synthroid 105 mcg. She denies thyroid surgery or radiation.   She has not checked her sugars in "a while".    Pt is looking for a new PCP.   Patient Active Problem List   Diagnosis Date Noted  . HYPOTHYROIDISM 10/16/2006  . DIABETES MELLITUS, TYPE II 10/16/2006   Past Medical History  Diagnosis Date  . Thyroid disease   . Diabetes mellitus   . Hyperlipidemia    History reviewed. No pertinent past surgical history. No Known Allergies Prior to Admission medications   Medication Sig Start Date End Date Taking? Authorizing Provider  glimepiride (AMARYL) 4 MG tablet Take 4 mg by mouth daily before breakfast.   Yes Historical Provider, MD  ibuprofen (ADVIL,MOTRIN) 200 MG tablet Take 200 mg by mouth every 6 (six) hours as needed for moderate pain.   Yes Historical Provider, MD  levothyroxine (SYNTHROID, LEVOTHROID) 150 MCG tablet Take 150 mcg by mouth daily. 07/15/15  Yes Historical Provider, MD  metFORMIN (GLUCOPHAGE-XR) 500 MG 24 hr tablet Take 1,000 mg by mouth 2 (two) tsimes daily. 07/15/15  Yes Historical Provider, MD  metoprolol (LOPRESSOR) 25 MG tablet Take 0.5 tablets (12.5 mg total) by mouth 2 (two) times daily. 08/23/15  Yes Kayla Rose, PA-C  simvastatin (ZOCOR) 10 MG tablet TAKE 1 TABLET BY MOUTH ONCE DAILY 07/15/15  Yes Historical Provider, MD  Social History   Social History  . Marital Status: Single    Spouse Name: N/A  . Number of Children: N/A  . Years of Education: N/A   Occupational History  . Not on file.   Social History Main Topics  . Smoking status: Former Smoker    Quit date: 07/11/1996  . Smokeless tobacco: Not on file  . Alcohol Use: No  . Drug Use: No  . Sexual Activity: Not on file   Other Topics Concern  . Not on file   Social History Narrative   Review of Systems  Constitutional: Negative for fatigue and unexpected weight change.  Eyes: Negative for visual disturbance.  Respiratory: Negative  for chest tightness and shortness of breath.   Cardiovascular: Negative for chest pain, palpitations and leg swelling.  Gastrointestinal: Positive for abdominal pain. Negative for blood in stool.  Genitourinary: Positive for dysuria, urgency and frequency. Negative for hematuria and difficulty urinating.  Neurological: Negative for dizziness, syncope, speech difficulty, weakness, light-headedness and headaches.   Objective:   Physical Exam  Constitutional: She is oriented to person, place, and time. She appears well-developed and well-nourished.  HENT:  Head: Normocephalic and atraumatic.  Eyes: Conjunctivae and EOM are normal. Pupils are equal, round, and reactive to light.  Neck: Carotid bruit is not present.  Cardiovascular: Regular rhythm, normal heart sounds and intact distal pulses.  Tachycardia present.   Pulmonary/Chest: Effort normal and breath sounds normal.  Abdominal: Soft. She exhibits no pulsatile midline mass. There is no tenderness. There is no CVA tenderness.  Musculoskeletal: She exhibits no edema.  No LE edema  Neurological: She is alert and oriented to person, place, and time.  Skin: Skin is warm and dry.  Psychiatric: She has a normal mood and affect. Her behavior is normal.  Vitals reviewed.  Filed Vitals:   08/25/15 1645  BP: 175/127  Pulse: 124  Temp: 98.1 F (36.7 C)  TempSrc: Oral  Resp: 20  Height: 5' 2.5" (1.588 m)  Weight: 232 lb 9.6 oz (105.507 kg)  SpO2: 96%   Results for orders placed or performed in visit on 08/25/15  POCT Microscopic Urinalysis ([facility])  Result Value Ref Range   WBC,UR,HPF,POC Many (A) None WBC/hpf   RBC,UR,HPF,POC Many (A) None RBC/hpf   Bacteria Moderate (A) None, Too numerous to count   Mucus Absent Absent   Epithelial Cells, UR Per Microscopy Moderate (A) None, Too numerous to count cells/hpf  POCT urinalysis dipstick  Result Value Ref Range   Color, UA orange (A) yellow   Clarity, UA clear clear   Glucose, UA =100  (A) negative   Bilirubin, UA negative negative   Ketones, POC UA negative negative   Spec Grav, UA 1.020    Blood, UA moderate (A) negative   pH, UA 7.0    Protein Ur, POC =100 (A) negative   Urobilinogen, UA 0.2    Nitrite, UA Positive (A) Negative   Leukocytes, UA small (1+) (A) Negative  POCT glucose (manual entry)  Result Value Ref Range   POC Glucose 162 (A) 70 - 99 mg/dl   Assessment & Plan:   Rose GobbleKaren L Bowie is a 55 y.o. female Hemorrhagic cystitis - Plan: nitrofurantoin, macrocrystal-monohydrate, (MACROBID) 100 MG capsule Blood in the urine - Plan: POCT Microscopic Urinalysis ([facility]), POCT urinalysis dipstick, Basic metabolic panel, nitrofurantoin, macrocrystal-monohydrate, (MACROBID) 100 MG capsule Urgency of urination - Plan: POCT Microscopic Urinalysis ([facility]), POCT urinalysis dipstick, Urine culture, nitrofurantoin, macrocrystal-monohydrate, (MACROBID) 100 MG capsule  -  Possible nephrolith that had resolved by the time of CT in hospital, but with pyuria, and urinary symptoms, will treat for possible hemorrhagic cystitis at this time. Also will repeat urine culture as multiple bacteria noted on initial culture. Fluids, RTC precautions, and will need to have urine repeated to make sure the blood clears.  Hypertensive urgency - Plan: TSH, Basic metabolic panel, metoprolol tartrate (LOPRESSOR) 25 MG tablet Tachycardia - Plan: TSH  -Some whitecoat component with her hypertension, but still uncontrolled. No other symptoms while in office.  -Continue metoprolol 12.5 mg twice a day for now, but if persistent elevations above 160/90, increase to 25 mg twice a day. Also discussed possible ACE inhibitor given her diabetes, but again discussed at follow-up with PCP, within 2 weeks.  -ER/RTC precautions if persistent elevated pressures, or any new symptoms.  -Repeat BMP,   Hypothyroidism, unspecified hypothyroidism type - Plan: TSH  -Check TSH with the hypotension and tachycardia, but  less likely thyroid storm.  Type 2 diabetes mellitus without complication, without long-term current use of insulin (HCC) - Plan: POCT glucose (manual entry)  -Follow-up with Dr. Talmage Nap. glucose reading in office improved versus ER visit 2 days ago.  Leukocytosis - Plan: CBC with Differential/Platelet  -Repeat CBC, afebrile currently, and may be due to UTI.  Meds ordered this encounter  Medications  . nitrofurantoin, macrocrystal-monohydrate, (MACROBID) 100 MG capsule    Sig: Take 1 capsule (100 mg total) by mouth 2 (two) times daily.    Dispense:  14 capsule    Refill:  0  . metoprolol tartrate (LOPRESSOR) 25 MG tablet    Sig: Take 0.5 tablets (12.5 mg total) by mouth 2 (two) times daily.    Dispense:  30 tablet    Refill:  0   Patient Instructions     For the blood in the urine and your urinary symptoms, start the antibiotic, one pill twice per day. I will recheck the urine culture to see if there is infection on today's urine, and we will let you know about those results. If you have any worsening of the symptoms, fever, or back pain returns, return for recheck.  For your blood pressure, it did go down some when we rechecked it. You can continue one half pill of the metoprolol twice per day, but iff running over 160/100 - increase to 1 full pill twice per day. Follow-up with a primary care provider or [place] within the next 2 weeks to see if those readings are improved, and to discuss possible changes in meds (you may benefit from a different class of medication called ace inhibitors with having diabetes, but want to make sure kidney test is ok first). Keep a record of your blood pressures outside of the office and bring them to the next office visit.   Return to the clinic or go to the nearest emergency room if any of your symptoms worsen or new symptoms occur.     Urinary Tract Infection Urinary tract infections (UTIs) can develop anywhere along your urinary tract. Your urinary tract  is your body's drainage system for removing wastes and extra water. Your urinary tract includes two kidneys, two ureters, a bladder, and a urethra. Your kidneys are a pair of bean-shaped organs. Each kidney is about the size of your fist. They are located below your ribs, one on each side of your spine. CAUSES Infections are caused by microbes, which are microscopic organisms, including fungi, viruses, and bacteria. These organisms are so small that  they can only be seen through a microscope. Bacteria are the microbes that most commonly cause UTIs. SYMPTOMS  Symptoms of UTIs may vary by age and gender of the patient and by the location of the infection. Symptoms in young women typically include a frequent and intense urge to urinate and a painful, burning feeling in the bladder or urethra during urination. Older women and men are more likely to be tired, shaky, and weak and have muscle aches and abdominal pain. A fever may mean the infection is in your kidneys. Other symptoms of a kidney infection include pain in your back or sides below the ribs, nausea, and vomiting. DIAGNOSIS To diagnose a UTI, your caregiver will ask you about your symptoms. Your caregiver will also ask you to provide a urine sample. The urine sample will be tested for bacteria and white blood cells. White blood cells are made by your body to help fight infection. TREATMENT  Typically, UTIs can be treated with medication. Because most UTIs are caused by a bacterial infection, they usually can be treated with the use of antibiotics. The choice of antibiotic and length of treatment depend on your symptoms and the type of bacteria causing your infection. HOME CARE INSTRUCTIONS  If you were prescribed antibiotics, take them exactly as your caregiver instructs you. Finish the medication even if you feel better after you have only taken some of the medication.  Drink enough water and fluids to keep your urine clear or pale  yellow.  Avoid caffeine, tea, and carbonated beverages. They tend to irritate your bladder.  Empty your bladder often. Avoid holding urine for long periods of time.  Empty your bladder before and after sexual intercourse.  After a bowel movement, women should cleanse from front to back. Use each tissue only once. SEEK MEDICAL CARE IF:   You have back pain.  You develop a fever.  Your symptoms do not begin to resolve within 3 days. SEEK IMMEDIATE MEDICAL CARE IF:   You have severe back pain or lower abdominal pain.  You develop chills.  You have nausea or vomiting.  You have continued burning or discomfort with urination. MAKE SURE YOU:   Understand these instructions.  Will watch your condition.  Will get help right away if you are not doing well or get worse.   This information is not intended to replace advice given to you by your health care provider. Make sure you discuss any questions you have with your health care provider.   Document Released: 11/02/2004 Document Revised: 10/14/2014 Document Reviewed: 03/03/2011 Elsevier Interactive Patient Education 2016 [employer]. Hematuria, Adult Hematuria is blood in your urine. It can be caused by a bladder infection, kidney infection, prostate infection, kidney stone, or cancer of your urinary tract. Infections can usually be treated with medicine, and a kidney stone usually will pass through your urine. If neither of these is the cause of your hematuria, further workup to find out the reason may be needed. It is very important that you tell your health care provider about any blood you see in your urine, even if the blood stops without treatment or happens without causing pain. Blood in your urine that happens and then stops and then happens again can be a symptom of a very serious condition. Also, pain is not a symptom in the initial stages of many urinary cancers. HOME CARE INSTRUCTIONS   Drink lots of fluid, 3-4 quarts a  day. If you have been diagnosed with an  infection, cranberry juice is especially recommended, in addition to large amounts of water.  Avoid caffeine, tea, and carbonated beverages because they tend to irritate the bladder.  Avoid alcohol because it may irritate the prostate.  Take all medicines as directed by your health care provider.  If you were prescribed an antibiotic medicine, finish it all even if you start to feel better.  If you have been diagnosed with a kidney stone, follow your health care provider's instructions regarding straining your urine to catch the stone.  Empty your bladder often. Avoid holding urine for long periods of time.  After a bowel movement, women should cleanse front to back. Use each tissue only once.  Empty your bladder before and after sexual intercourse if you are a female. SEEK MEDICAL CARE IF:  You develop back pain.  You have a fever.  You have a feeling of sickness in your stomach (nausea) or vomiting.  Your symptoms are not better in 3 days. Return sooner if you are getting worse. SEEK IMMEDIATE MEDICAL CARE IF:   You develop severe vomiting and are unable to keep the medicine down.  You develop severe back or abdominal pain despite taking your medicines.  You begin passing a large amount of blood or clots in your urine.  You feel extremely weak or faint, or you pass out. MAKE SURE YOU:   Understand these instructions.  Will watch your condition.  Will get help right away if you are not doing well or get worse.   This information is not intended to replace advice given to you by your health care provider. Make sure you discuss any questions you have with your health care provider.   Document Released: 01/23/2005 Document Revised: 02/13/2014 Document Reviewed: 09/23/2012 Elsevier Interactive Patient Education 2016 [employer].      IF you received an x-ray today, you will receive an invoice from [facility]. Please  contact [facility] at [phone] with questions or concerns regarding your invoice.   IF you received labwork today, you will receive an invoice from [employer]. Please contact [facility] at [phone] with questions or concerns regarding your invoice.   Our billing staff will not be able to assist you with questions regarding bills from these companies.  You will be contacted with the lab results as soon as they are available. The fastest way to get your results is to activate your My Chart account. Instructions are located on the last page of this paperwork. If you have not heard from [place] regarding the results in 2 weeks, please contact this office.         I personally performed the services described in this documentation, which was scribed in my presence. The recorded information has been reviewed and considered, and addended by me as needed.   Signed,   Meredith Staggers, MD [facility].  08/25/2015 11:49 PM

## 2015-08-26 LAB — CBC WITH DIFFERENTIAL/PLATELET
BASOS ABS: 0 {cells}/uL (ref 0–200)
Basophils Relative: 0 %
EOS ABS: 104 {cells}/uL (ref 15–500)
Eosinophils Relative: 1 %
HEMATOCRIT: 40.9 % (ref 35.0–45.0)
HEMOGLOBIN: 13.1 g/dL (ref 11.7–15.5)
LYMPHS ABS: 3120 {cells}/uL (ref 850–3900)
LYMPHS PCT: 30 %
MCH: 26.6 pg — AB (ref 27.0–33.0)
MCHC: 32 g/dL (ref 32.0–36.0)
MCV: 83 fL (ref 80.0–100.0)
MONO ABS: 728 {cells}/uL (ref 200–950)
MPV: 10.9 fL (ref 7.5–12.5)
Monocytes Relative: 7 %
NEUTROS PCT: 62 %
Neutro Abs: 6448 cells/uL (ref 1500–7800)
Platelets: 280 10*3/uL (ref 140–400)
RBC: 4.93 MIL/uL (ref 3.80–5.10)
RDW: 14.4 % (ref 11.0–15.0)
WBC: 10.4 10*3/uL (ref 3.8–10.8)

## 2015-08-26 LAB — TSH: TSH: 4.38 mIU/L

## 2015-08-26 LAB — BASIC METABOLIC PANEL
BUN: 15 mg/dL (ref 7–25)
CO2: 28 mmol/L (ref 20–31)
Calcium: 9.6 mg/dL (ref 8.6–10.4)
Chloride: 99 mmol/L (ref 98–110)
Creat: 0.91 mg/dL (ref 0.50–1.05)
GLUCOSE: 146 mg/dL — AB (ref 65–99)
POTASSIUM: 3.7 mmol/L (ref 3.5–5.3)
SODIUM: 136 mmol/L (ref 135–146)

## 2015-08-28 LAB — URINE CULTURE: Colony Count: >=100000

## 2015-10-01 ENCOUNTER — Other Ambulatory Visit: Payer: Self-pay | Admitting: Family Medicine

## 2015-10-01 DIAGNOSIS — I160 Hypertensive urgency: Secondary | ICD-10-CM

## 2017-06-24 IMAGING — CT CT ABD-PELV W/ CM
3 of 5 series · 16 of 46 positions shown, 18 images · IV contrast (ISOVUE)
Comparison: None.

CLINICAL DATA: Low back pain beginning last night. The patient
reports hematuria.

EXAM:
CT ABDOMEN AND PELVIS WITH CONTRAST
TECHNIQUE: Multidetector CT imaging of the abdomen and pelvis was performed
using the standard protocol following bolus administration of
intravenous contrast.
CONTRAST:  100 ml TNWGRW-WJJ IOPAMIDOL (TNWGRW-WJJ) INJECTION 61%

[Series 2: abd/pel with · axial · 0.84mm/px · z∈[+966,+1321]mm · 12 of 85 slices shown, 14 images]
[im 7/85  soft-tissue]
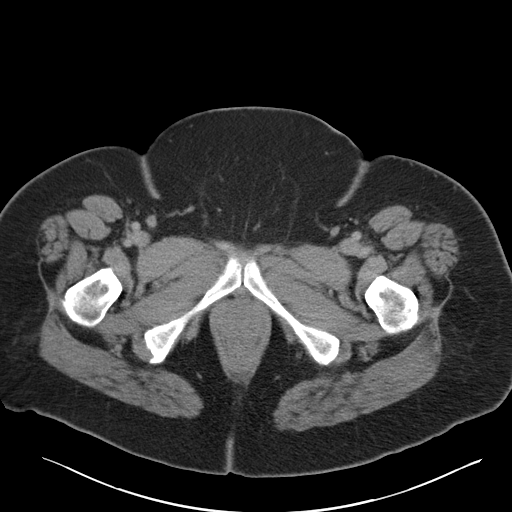
[im 7/85  bone]
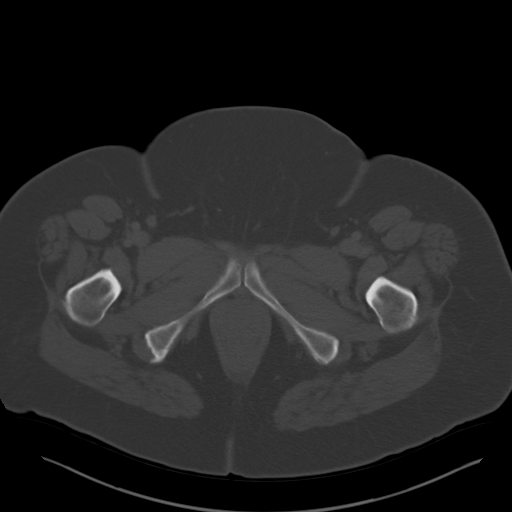
[im 13/85  soft-tissue]
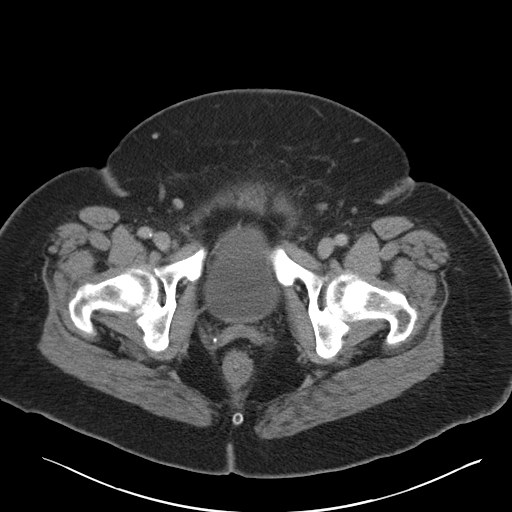
[im 20/85  soft-tissue]
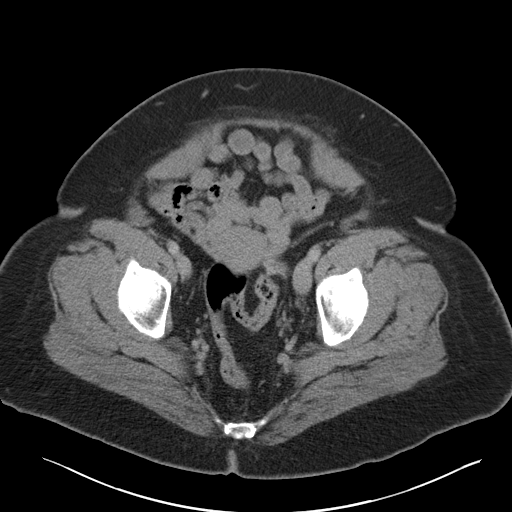
[im 26/85  soft-tissue]
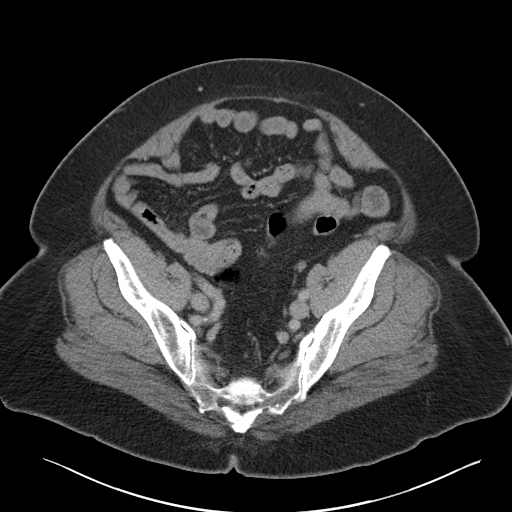
[im 33/85  soft-tissue]
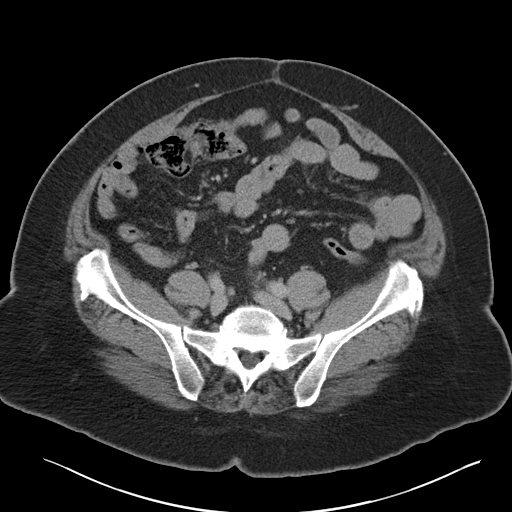
[im 39/85  soft-tissue]
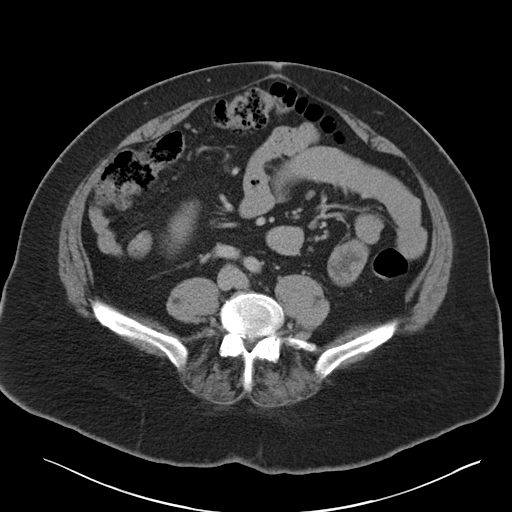
[im 46/85  soft-tissue]
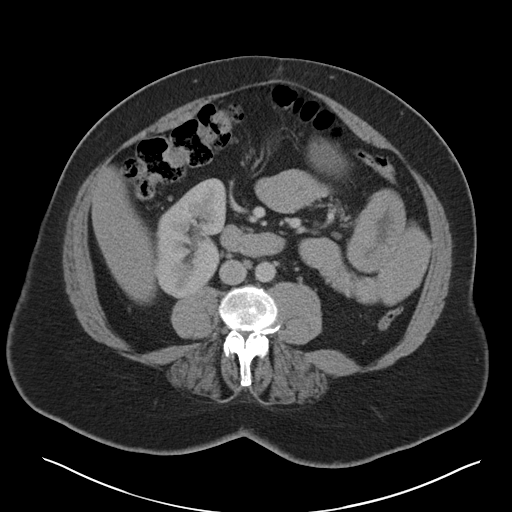
[im 52/85  soft-tissue]
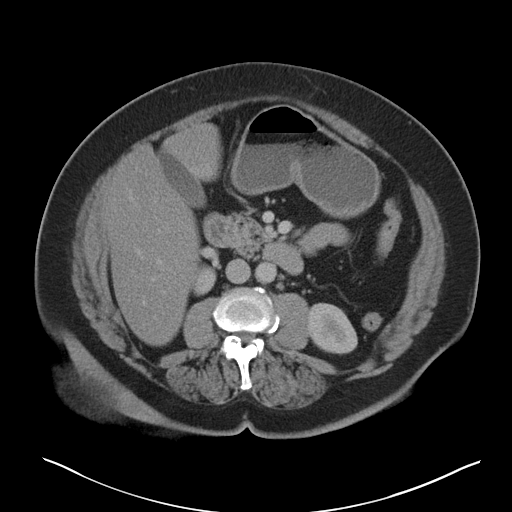
[im 59/85  soft-tissue]
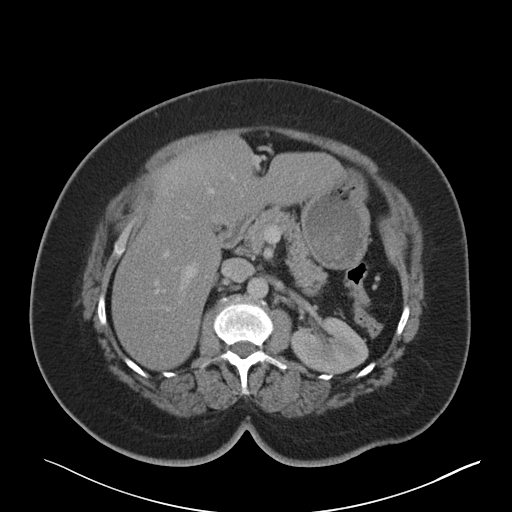
[im 59/85  bone]
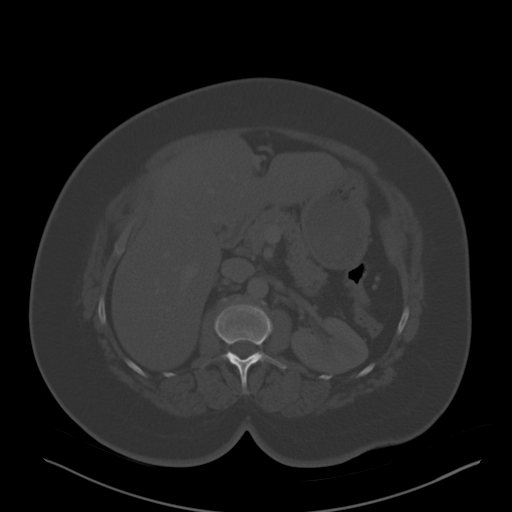
[im 65/85  soft-tissue]
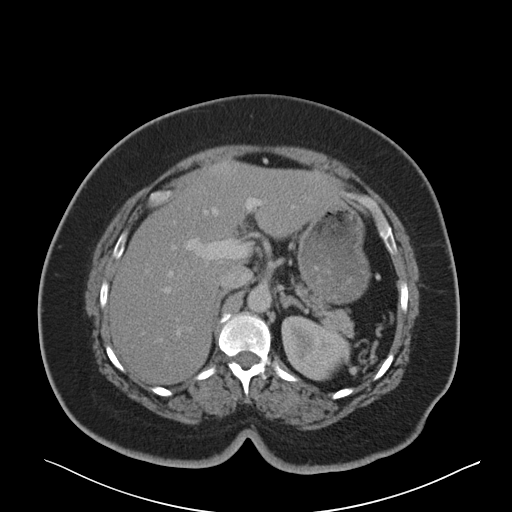
[im 72/85  soft-tissue]
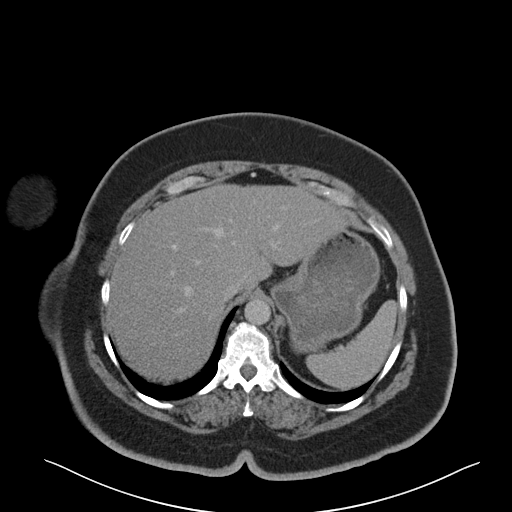
[im 78/85  soft-tissue]
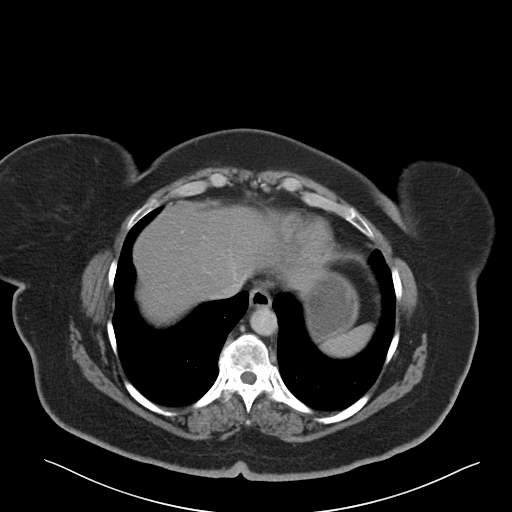

[Series 4: coronal a/|p · coronal · 0.94mm/px · 3 of 191 slices shown]
[im 64/191  soft-tissue]
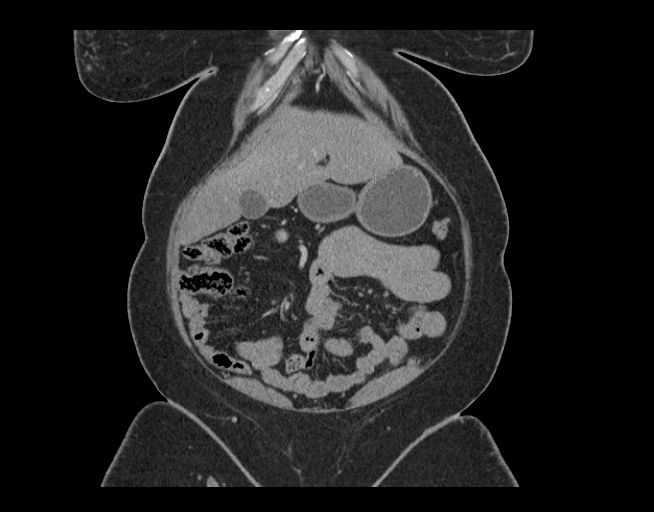
[im 85/191  soft-tissue]
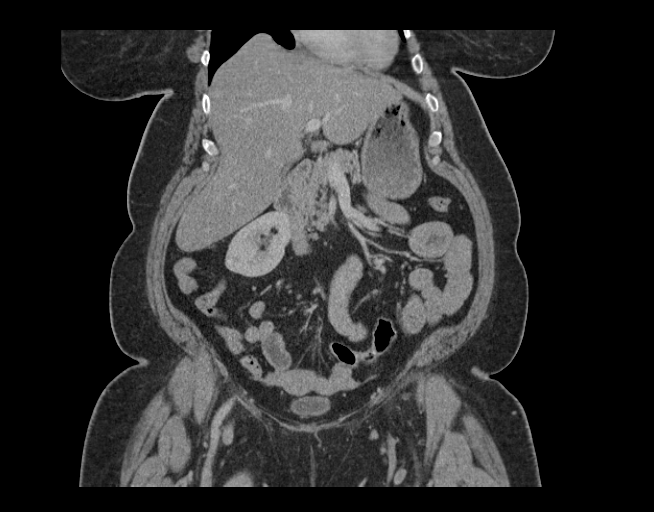
[im 106/191  soft-tissue]
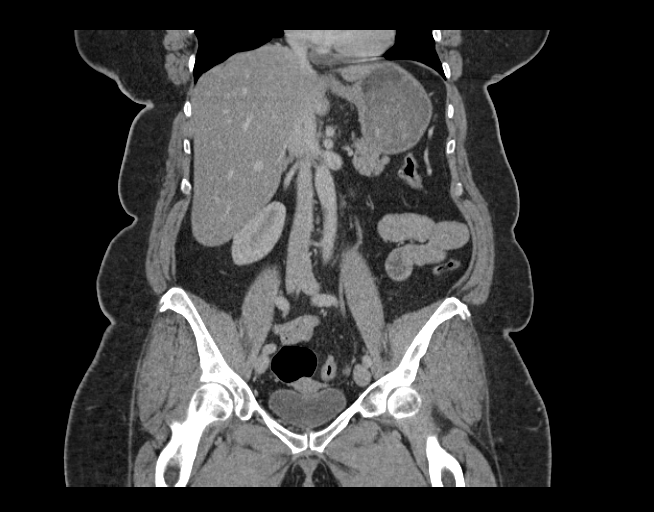

[Series 7: lung · axial · 0.84mm/px · 1 of 60 slices shown]
[im 6/60  bone]
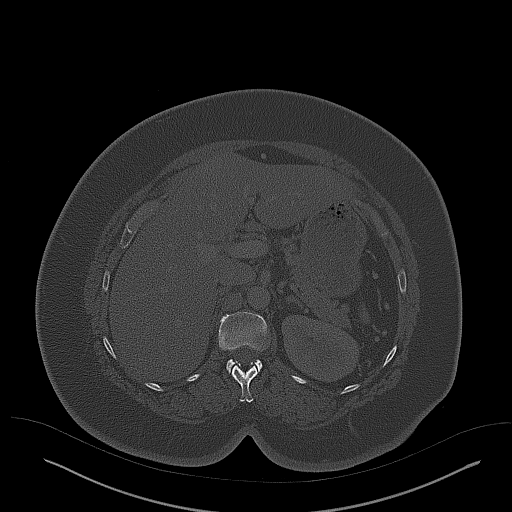

[16 of 46 positions shown; findings below may reference images not displayed]

FINDINGS: The lung bases are clear.  No pleural or pericardial effusion.

The right kidney is ptotic. The kidneys are otherwise unremarkable.
Uterus, adnexa and urinary bladder appear normal.

The gallbladder appears normal. The liver is somewhat low
attenuating compatible with fatty infiltration. The spleen, adrenal
glands and pancreas appear normal. The stomach, small and large
bowel and appendix appear normal. There is no lymphadenopathy or
fluid.

No lytic or sclerotic bony lesion is identified. Multilevel disc
bulging is noted.
IMPRESSION: No acute abnormality or finding to explain the patient's symptoms.

Fatty infiltration of the liver.

## 2023-09-10 ENCOUNTER — Telehealth (HOSPITAL_BASED_OUTPATIENT_CLINIC_OR_DEPARTMENT_OTHER): Payer: Self-pay | Admitting: Orthopaedic Surgery

## 2023-09-10 NOTE — Telephone Encounter (Signed)
 Patient was sent up by pt and wants to know if she can be doubled books before the 8/27

## 2023-09-10 NOTE — Telephone Encounter (Signed)
 This patient walked in when I was at lunch from [facility] and said they [facility] told her  here. I tried to call the patient back and did not get an answer. Just FYI

## 2023-10-03 ENCOUNTER — Ambulatory Visit (HOSPITAL_BASED_OUTPATIENT_CLINIC_OR_DEPARTMENT_OTHER): Payer: Self-pay | Admitting: Orthopaedic Surgery

## 2023-12-10 ENCOUNTER — Encounter: Payer: Self-pay | Admitting: Radiology
